# Patient Record
Sex: Male | Born: 2000 | Race: Black or African American | Hispanic: No | Marital: Single | Smoking: Never smoker
Health system: Southern US, Community
[De-identification: ages and names within clinical notes are randomized; demographics above are authoritative.]

---
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
## 2022-12-26 ENCOUNTER — Ambulatory Visit (HOSPITAL_COMMUNITY)
Admission: EM | Admit: 2022-12-26 | Discharge: 2022-12-26 | Disposition: A | Discharge status: Home / Self Care | Payer: Medicaid Other | Attending: Psychiatry | Admitting: Psychiatry

## 2022-12-26 ENCOUNTER — Encounter (HOSPITAL_COMMUNITY): Payer: Self-pay | Admitting: Psychiatry

## 2022-12-26 DIAGNOSIS — F32A Depression, unspecified: Secondary | ICD-10-CM | POA: Diagnosis not present

## 2022-12-26 DIAGNOSIS — F419 Anxiety disorder, unspecified: Secondary | ICD-10-CM | POA: Diagnosis not present

## 2022-12-26 DIAGNOSIS — R45851 Suicidal ideations: Secondary | ICD-10-CM | POA: Insufficient documentation

## 2022-12-26 NOTE — Discharge Instructions (Addendum)
Please come to [facility] (this facility, second floor, [address] [place], [place] [address]) for medication management and counseling services.  Walk in hours for medication management are: Monday through Friday 7:30AM - 11AM Walk in hours for counseling are: Monday through Thursday 7:30AM-4PM and Friday 12PM-4PM  Walk ins are seen on a first come, first served basis. Walk in spots are limited. To increase the likelihood of being the same day, please come early, by 7AM.

## 2022-12-26 NOTE — ED Notes (Signed)
Patient discharged by Jacqueline Lee, NP with written and verbal instructions. 

## 2022-12-26 NOTE — Progress Notes (Signed)
   12/26/22 1547  [facility] Triage Screening (Walk-ins at [facility] only)  How Did You Hear About [place]? Self  What Is the Reason for Your Visit/Call Today? Pt is a 21 yo male who presented voluntarily and unaccompanied due to worsening depression and anxiety. Pt stated that he was referred to the [facility] by the [facility] from his employment. Pt stated he has been struggling with depression ana anxiety over the last few weeks especially and recently has been having suicidal thoughts "pop in his head." Pt stated he would never act on these thoughts and had no specific plan. Pt stated that he was intoxicated from drinking alcohol on Christmas Day while with his family and thought of continuing to drink an excess of alcohol and in that way kill himself. Pt stated that if he had been sober he never would have had as much alcohol. Pt stated he has since stopped drinking alcohol entirely and stated he plans to stop permanently. Pt denied any HI but stated that he has thought about "beating up" his younger brother because they don't get along. Pt denied NSSH, AVH and paranoia. Pt reported also occasionally smoking cannabis but stated he decided to stop that as well. Pt stated he is feeling sad, hopeless at times, overly tired at times, like a failure at times and having rouble concentrating. Pt stated that he has been having trouble in his romantic relationship recently but did not want to give details. Pt stated he has been in the same relationship for about a year.  How Long Has This Been Causing You Problems? 1 wk - 1 month  Have You Recently Had Any Thoughts About Hurting Yourself? Yes  How long ago did you have thoughts about hurting yourself? Christmas Day  Are You Planning to Commit Suicide/Harm Yourself At This time? No  Have you Recently Had Thoughts About Hurting Someone Karolee Ohs? Yes  How long ago did you have thoughts of harming others? Christmas Day  Are You Planning To Harm Someone At This Time? No  Are you currently  experiencing any auditory, visual or other hallucinations? No  Have You Used Any Alcohol or Drugs in the Past 24 Hours? No  Do you have any current medical co-morbidities that require immediate attention? No  Clinician description of patient physical appearance/behavior: Pt was alert, calm, cooperative and seemed fully oriented. Pt's speecha nd movement were within normal limits. Pt's judgment and insight seemed good. Pt's mood seemed sad and his flat affect seemed congruent.  What Do You Feel Would Help You the Most Today? Treatment for Depression or other mood problem  If access to [facility] was not available, would you have sought care in the Emergency Department? Yes  Determination of Need Routine (7 days)  Options For Referral Medication Management;Outpatient Therapy   Vernis Cabacungan T. Jimmye Norman, MS, [facility], [facility] Triage Specialist [facility]
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
## 2023-01-22 ENCOUNTER — Encounter (HOSPITAL_COMMUNITY): Payer: Self-pay

## 2023-01-22 ENCOUNTER — Ambulatory Visit (INDEPENDENT_AMBULATORY_CARE_PROVIDER_SITE_OTHER): Payer: Medicaid Other | Admitting: Mental Health

## 2023-01-22 DIAGNOSIS — F322 Major depressive disorder, single episode, severe without psychotic features: Secondary | ICD-10-CM
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
## 2023-01-22 NOTE — Progress Notes (Unsigned)
Safety Plan   Step One: Remove Access   What might I use to hurt myself?Razor, knifeRazor, knifeWhat other actions can I take to limit ways that I can hurt myself?Does not keep around him.Does not keep around him. Step Two: Warning Signs   What are my specific triggers or warning signs a crisis is developing?Feeling down, sad, or crying; Feeling hopeless things won't ever get better; Feeling worthless or a failureFeeling down, sad, or crying; Feeling hopeless things won't ever get better; Feeling worthless or a failure Step Three: Distracting Activities   What engaging and calming activities can I do to distract myself?Go online, play video games; Read; Meditate, pray; Go for a walk, exerciseGo online, play video games; Read; Meditate, pray; Go for a walk, exercise Step Four: Distracting People and Places   Who helps me to take my mind off of my problems?[facility]Where can I go to take my mind off my problems?ChurchChurch Step Five: Social Support   Which family members or friends can I talk to to help me feel better?DeniesDeniesWho can help me get through the crisis?LevetteLevetteWho is supportive?Arneta Cliche Step Six: Professional Help   Where can I get help?[place] [facility]/ [facility] [phone] [facility])[address] - [facility]/ [facility]/ [facility] -[phone] Altern[facility])

## 2023-02-01 ENCOUNTER — Ambulatory Visit (HOSPITAL_COMMUNITY): Payer: Medicaid Other | Admitting: Physician Assistant

## 2023-02-01 ENCOUNTER — Encounter (HOSPITAL_COMMUNITY): Payer: Self-pay

## 2023-02-05 ENCOUNTER — Encounter (HOSPITAL_COMMUNITY): Payer: Self-pay | Admitting: Physician Assistant

## 2023-02-05 ENCOUNTER — Ambulatory Visit (INDEPENDENT_AMBULATORY_CARE_PROVIDER_SITE_OTHER): Payer: Medicaid Other | Admitting: Physician Assistant

## 2023-02-05 VITALS — BP 132/74 | HR 58 | Ht 68.0 in | Wt 159.2 lb

## 2023-02-05 DIAGNOSIS — F332 Major depressive disorder, recurrent severe without psychotic features: Secondary | ICD-10-CM

## 2023-02-05 DIAGNOSIS — F411 Generalized anxiety disorder: Secondary | ICD-10-CM | POA: Diagnosis not present
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
## 2023-02-14 ENCOUNTER — Ambulatory Visit (INDEPENDENT_AMBULATORY_CARE_PROVIDER_SITE_OTHER): Payer: Medicaid Other | Admitting: Mental Health

## 2023-02-14 DIAGNOSIS — F332 Major depressive disorder, recurrent severe without psychotic features: Secondary | ICD-10-CM | POA: Insufficient documentation

## 2023-02-14 DIAGNOSIS — F411 Generalized anxiety disorder: Secondary | ICD-10-CM
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
## 2023-02-19 ENCOUNTER — Encounter (HOSPITAL_COMMUNITY): Payer: Self-pay | Admitting: Physician Assistant

## 2023-02-19 ENCOUNTER — Ambulatory Visit (INDEPENDENT_AMBULATORY_CARE_PROVIDER_SITE_OTHER): Payer: Medicaid Other | Admitting: Physician Assistant

## 2023-02-19 DIAGNOSIS — F411 Generalized anxiety disorder: Secondary | ICD-10-CM

## 2023-02-19 DIAGNOSIS — F331 Major depressive disorder, recurrent, moderate: Secondary | ICD-10-CM

## 2023-02-19 MED ORDER — ESCITALOPRAM OXALATE 10 MG PO TABS: 10.0000 mg | ORAL_TABLET | Freq: Every day | ORAL | 1 refills | Status: DC

## 2023-02-19 NOTE — Progress Notes (Signed)
BH MD/PA/NP OP Progress Note  02/19/2023 9:46 PM Garcia Carrisalez  MRN:  [id number]  Chief Complaint:  Chief Complaint  Patient presents with   Follow-up   Medication Management   HPI:   Chad Marshall is a 22 year old, African-American male with a past psychiatric history significant for major depressive disorder and anxiety who presents to [facility] for follow-up and medication management.  Patient is not currently taking any psychiatric medications at this time.  Since the last encounter, patient states that he has felt bad.  He reports that he recently went to see his cousin in hopes of redeeming their relationship; however, there was a disagreement and now he feels that the relationship will never be resolved.  Patient endorses experiencing depressive episodes but states that they were not big and he experienced no breakdowns.  He does admit to experiencing dissociation triggered by distant memories from his past.  Patient endorses experiencing depressive episodes every day characterized by lack of motivation, irritability, and intrusive thoughts.  Patient states that his intrusive thoughts are about mistaking may with a girl as well as unresolved issues with his brothers.  Patient continues to endorse elevated anxiety and rates his anxiety at 10 out of 10.  A PHQ-9 screen was performed with the patient scoring a 17.  A GAD-7 screen was also performed with the patient scoring a 16.  Patient is alert and oriented x 4, calm, cooperative, and engaged in conversation during the encounter.  Patient endorses empty mood.  Patient denies suicidal or homicidal ideation.  He further denies auditory or visual hallucinations and does not appear to be responding to internal/external stimuli.  Patient endorses issues with sleep and states that he often has to force himself to sleep through the use of Benadryl.  Patient endorses good appetite and eats on average 3 meals  per day.  Patient denies alcohol consumption, tobacco use, and illicit drug use.  Visit Diagnosis:    ICD-10-CM   1. Moderate episode of recurrent major depressive disorder (HCC)  F33.1 escitalopram (LEXAPRO) 10 MG tablet    DISCONTINUED: escitalopram (LEXAPRO) 10 MG tablet    2. Anxiety state  F41.1 escitalopram (LEXAPRO) 10 MG tablet    DISCONTINUED: escitalopram (LEXAPRO) 10 MG tablet      Past Psychiatric History:  Major depressive disorder Anxiety  Past Medical History: History reviewed. No pertinent past medical history. History reviewed. No pertinent surgical history.  Family Psychiatric History:  Patient denies a family history of psychiatric illness   Family history of suicide attempt: Patient denies Family history of homicide attempt: Patient reports that his brother died via homicide related event Family history of substance abuse: Patient is unsure family history of substance abuse  Family History: History reviewed. No pertinent family history.  Social History:  Social History   Socioeconomic History   Marital status: Single    Spouse name: Not on file   Number of children: Not on file   Years of education: Not on file   Highest education level: Not on file  Occupational History   Not on file  Tobacco Use   Smoking status: Never   Smokeless tobacco: Never  Substance and Sexual Activity   Alcohol use: No   Drug use: No   Sexual activity: Never  Other Topics Concern   Not on file  Social History Narrative   Not on file   Social Determinants of Health   Financial Resource Strain: Low Risk  (01/22/2023)  Overall Financial Resource Strain (CARDIA)    Difficulty of Paying Living Expenses: Not very hard  Food Insecurity: No Food Insecurity (01/22/2023)   Hunger Vital Sign    Worried About Running Out of Food in the Last Year: Never true    Ran Out of Food in the Last Year: Never true  Transportation Needs: No Transportation Needs (01/22/2023)   PRAPARE -  Hydrologist (Medical): No    Lack of Transportation (Non-Medical): No  Physical Activity: Inactive (01/22/2023)   Exercise Vital Sign    Days of Exercise per Week: 0 days    Minutes of Exercise per Session: 0 min  Stress: Stress Concern Present (01/22/2023)   [place]    Feeling of Stress : Very much  Social Connections: Socially Isolated (01/22/2023)   Social Connection and Isolation Panel [NHANES]    Frequency of Communication with Friends and Family: Never    Frequency of Social Gatherings with Friends and Family: Once a week    Attends Religious Services: More than 4 times per year    Active Member of [employer] or Organizations: No    Attends Music therapist: Never    Marital Status: Never married    Allergies: No Known Allergies  Metabolic Disorder Labs: No results found for: "HGBA1C", "MPG" No results found for: "PROLACTIN" No results found for: "CHOL", "TRIG", "HDL", "CHOLHDL", "VLDL", "LDLCALC" No results found for: "TSH"  Therapeutic Level Labs: No results found for: "LITHIUM" No results found for: "VALPROATE" No results found for: "CBMZ"  Current Medications: Current Outpatient Medications  Medication Sig Dispense Refill   azithromycin (ZITHROMAX) 250 MG tablet Take 1 tablet (250 mg total) by mouth daily. Take first 2 tablets together, then 1 every day until finished. 6 tablet 0   escitalopram (LEXAPRO) 10 MG tablet Take 1 tablet (10 mg total) by mouth daily. 30 tablet 1   guaifenesin (ROBITUSSIN) 100 MG/5ML syrup Take 5-10 mLs (100-200 mg total) by mouth every 4 (four) hours as needed for cough. 60 mL 0   ibuprofen (ADVIL,MOTRIN) 800 MG tablet Take 1 tablet (800 mg total) by mouth every 8 (eight) hours as needed. 21 tablet 0   promethazine-dextromethorphan (PROMETHAZINE-DM) 6.25-15 MG/5ML syrup Take 5 mLs by mouth 4 (four) times daily as needed for cough. 120 mL 0    No current facility-administered medications for this visit.     Musculoskeletal: Strength & Muscle Tone: within normal limits Gait & Station: normal Patient leans: N/A  Psychiatric Specialty Exam: Review of Systems  Psychiatric/Behavioral:  Positive for sleep disturbance. Negative for decreased concentration, dysphoric mood, hallucinations, self-injury and suicidal ideas. The patient is nervous/anxious. The patient is not hyperactive.     There were no vitals taken for this visit.There is no height or weight on file to calculate BMI.  General Appearance: Casual  Eye Contact:  Good  Speech:  Clear and Coherent and Normal Rate  Volume:  Normal  Mood:  Anxious and Depressed  Affect:  Appropriate  Thought Process:  Coherent, Goal Directed, and Descriptions of Associations: Intact  Orientation:  Full (Time, Place, and Person)  Thought Content: WDL   Suicidal Thoughts:  No  Homicidal Thoughts:  No  Memory:  Immediate;   Good Recent;   Good Remote;   Good  Judgement:  Good  Insight:  Fair  Psychomotor Activity:  Normal  Concentration:  Concentration: Good and Attention Span: Good  Recall:  Good  Fund of Knowledge: Good  Language: Good  Akathisia:  No  Handed:  Left  AIMS (if indicated): not done  Assets:  Communication Skills Desire for Improvement Housing Social Support Transportation Vocational/Educational  ADL's:  Intact  Cognition: WNL  Sleep:  Fair   Screenings: AUDIT    Health and safety inspector from 01/22/2023 in [facility]  Alcohol Use Disorder Identification Test Final Score (AUDIT) 7      GAD-7    Flowsheet Row Clinical Support from 02/05/2023 in [facility] Counselor from 01/22/2023 in [facility]  Total GAD-7 Score 16 15      PHQ2-9    Flowsheet Row Clinical Support from 02/19/2023 in [place] from 02/05/2023 in  [facility] Counselor from 01/22/2023 in [facility] from 12/26/2022 in [facility]  PHQ-2 Total Score '4 5 5 2  '$ PHQ-9 Total Score '17 18 26 10      '$ [place] from 02/19/2023 in [place] from 02/05/2023 in [facility] Counselor from 01/22/2023 in [place] CATEGORY High Risk High Risk High Risk        Assessment and Plan:   Vicent Daniel is a 22 year old, African-American male with a past psychiatric history significant for major depressive disorder and anxiety who presents to [facility] for follow-up and medication management.  She is not currently on any medications during the time of the encounter but after deliberation, patient agrees to be placed on medication for the management of his depressive symptoms and anxiety.  Patient states that he experiences depressive episodes every day characterized by lack of motivation, irritability, low mood, and intrusive thoughts.  Patient also endorses anxiety he rates a 10 out of 10.  Patient's symptoms appear to be attributed to unresolved issues from his past.  Patient was recommended being placed on escitalopram 10 mg daily for the management of his depressive symptoms and anxiety.  Patient was agreeable to recommendation.  Patient's medication to be e-prescribed to pharmacy of choice.  Collaboration of Care: Collaboration of Care: Medication Management AEB provider managing patient's psychiatric medications, Psychiatrist AEB patient being seen by a mental health provider, and Referral or follow-up with counselor/therapist AEB patient being seen by a licensed clinical social worker at this facility  Patient/Guardian was advised Release of Information must be obtained prior to any record  release in order to collaborate their care with an outside provider. Patient/Guardian was advised if they have not already done so to contact the registration department to sign all necessary forms in order for [place] to release information regarding their care.   Consent: Patient/Guardian gives verbal consent for treatment and assignment of benefits for services provided during this visit. Patient/Guardian expressed understanding and agreed to proceed.   1. Moderate episode of recurrent major depressive disorder (HCC)  - escitalopram (LEXAPRO) 10 MG tablet; Take 1 tablet (10 mg total) by mouth daily.  Dispense: 30 tablet; Refill: 1  2. Anxiety state  - escitalopram (LEXAPRO) 10 MG tablet; Take 1 tablet (10 mg total) by mouth daily.  Dispense: 30 tablet; Refill: 1  Patient to follow-up in 5 weeks Provider spent a total of 17 minutes with the patient/reviewing patient's chart  Malachy Mood, PA 02/19/2023, 9:46 PM

## 2023-03-21 ENCOUNTER — Ambulatory Visit (INDEPENDENT_AMBULATORY_CARE_PROVIDER_SITE_OTHER): Payer: Medicaid Other | Admitting: Mental Health

## 2023-03-21 DIAGNOSIS — F332 Major depressive disorder, recurrent severe without psychotic features: Secondary | ICD-10-CM | POA: Diagnosis not present

## 2023-03-21 NOTE — Progress Notes (Signed)
   THERAPIST PROGRESS NOTE  Session Time: 4:32pm ( 54 minutes)  Participation Level: Active  Behavioral Response: CasualAlertWNL  Type of Therapy: Individual Therapy  Treatment Goals addressed:  Woodie will decrease sxs of depression AEB development of x 3 effective coping skills with ability to reframe thinking patterns as needed within the next 90 days.      ProgressTowards Goals: Progressing  Interventions: CBT and Supportive  Summary: Chad Marshall is a 22 y.o. male who presents with dx of major depression, single severe. Chad Marshall presents for session alert and oriented; mood and affect adequate; WNL. Pleasant in presentation. Speech clear and coherent at normal rate and tone. Shares for depressive and anxiety sxs to have reduced and shares improvement in moods. Shares to have been ongoingly engaged in church and to have been baptized. 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 Shares working to come to acceptance with break up and ability to work on self. Notes work to be going well and able to interact with others at work in pleasant fashion. Shares continues to have feelings of frustration over incident in which took place over Christmas and feelings as if there is no justice. Shares feelings of wondering of his ability to gain stability in the community and develop into his manhood. Shares hx of father and not feeling supported with father wanting him to have a street mentality. Shares distorted thoughts with difficulty exploring alternatives. Able to engage with therapist and work to engage in cognitive coping. Denies SI/HI. Some progress with goals and improvement in sxs.   Suicidal/Homicidal: Nowithout intent/plan  Therapist Response: Therapist engaged Chad Marshall in therapy session. Engaged in rapport building. Completed check in and assessed for current level of functioning, sxs management and level of stressors. Provided safe space for Chad Marshall to  share thoughts and feelings related to stressors and feelings of depression. Explored with Chad Marshall factors that were going well for him and explored areas of difficulty and feelings of being stuck. Explored with Chad Marshall distorted thinking patterns and working to reframe thoughts and engage in positive affirmations and support and encouragement of self. Explored ability to set small goals for self and work towards increase self-esteem and self-confidence and identifying areas of progress in which he has already made for self. Reviewed session, provided follow up. Assessed for safety.   Plan: Return again in x 4 weeks.  Diagnosis: Severe episode of recurrent major depressive disorder, without psychotic features ([place])  Collaboration of Care: Other None  Patient/Guardian was advised Release of Information must be obtained prior to any record release in order to collaborate their care with an outside provider. Patient/Guardian was advised if they have not already done so to contact the registration department to sign all necessary forms in order for [place] to release information regarding their care.   Consent: Patient/Guardian gives verbal consent for treatment and assignment of benefits for services provided during this visit. Patient/Guardian expressed understanding and agreed to proceed.   Rockey Situ [place], [facility] 03/25/2023

## 2023-03-27 ENCOUNTER — Ambulatory Visit (INDEPENDENT_AMBULATORY_CARE_PROVIDER_SITE_OTHER): Payer: Medicaid Other | Admitting: Physician Assistant

## 2023-03-27 ENCOUNTER — Encounter (HOSPITAL_COMMUNITY): Payer: Self-pay | Admitting: Physician Assistant

## 2023-03-27 DIAGNOSIS — F411 Generalized anxiety disorder: Secondary | ICD-10-CM | POA: Diagnosis not present

## 2023-03-27 DIAGNOSIS — F331 Major depressive disorder, recurrent, moderate: Secondary | ICD-10-CM

## 2023-03-27 MED ORDER — ESCITALOPRAM OXALATE 20 MG PO TABS: 20.0000 mg | ORAL_TABLET | Freq: Every day | ORAL | 1 refills | Status: AC

## 2023-03-27 NOTE — Progress Notes (Signed)
BH MD/PA/NP OP Progress Note  03/27/2023 5:50 PM Chad Marshall  MRN:  [id number]  Chief Complaint:  Chief Complaint  Patient presents with   Follow-up   Medication Management   HPI:   Chad Marshall is a 22 year old, African-American male with a past psychiatric history significant for major depressive disorder and anxiety who presents to [facility] for follow-up and medication management.  Patient is currently being managed on the following medication: Escitalopram 10 mg daily.  Patient reports no issues or concerns regarding his current medication regimen.  Patient denies any adverse side effects at this time.  Patient does not believe that his medication is making much of a difference but does deny experiencing any depressive symptoms.  Patient endorses anxiety and rates his anxiety as 7 out of 10.  Patient denies experiencing any major life changes or events but states that he has been mentoring someone. A GAD-7 screen was performed with the patient scoring an 18.  Patient is alert and oriented x 4, calm, cooperative, and engaged in conversation during the encounter.  Patient endorses okay mood but states that he is self-conscious.  Patient denies suicidal or homicidal ideations.  He further denies auditory or visual hallucinations and does not appear to be responding to internal/external stimuli.  Patient endorses poor sleep and receives on average 3 to 4 hours of sleep each night.  Patient states that he often has to take Benadryl to force himself to sleep.  Patient endorses fair appetite.  Patient denies alcohol consumption, tobacco use, and illicit drug use.  Visit Diagnosis:    ICD-10-CM   1. Anxiety state  F41.1 escitalopram (LEXAPRO) 20 MG tablet    2. Moderate episode of recurrent major depressive disorder (HCC)  F33.1 escitalopram (LEXAPRO) 20 MG tablet      Past Psychiatric History:  Major depressive disorder Anxiety  Past Medical  History: History reviewed. No pertinent past medical history. History reviewed. No pertinent surgical history.  Family Psychiatric History:  Patient denies a family history of psychiatric illness   Family history of suicide attempt: Patient denies Family history of homicide attempt: Patient reports that his brother died via homicide related event Family history of substance abuse: Patient is unsure family history of substance abuse  Family History: History reviewed. No pertinent family history.  Social History:  Social History   Socioeconomic History   Marital status: Single    Spouse name: Not on file   Number of children: Not on file   Years of education: Not on file   Highest education level: Not on file  Occupational History   Not on file  Tobacco Use   Smoking status: Never   Smokeless tobacco: Never  Substance and Sexual Activity   Alcohol use: No   Drug use: No   Sexual activity: Never  Other Topics Concern   Not on file  Social History Narrative   Not on file   Social Determinants of Health   Financial Resource Strain: Low Risk  (01/22/2023)   Overall Financial Resource Strain (CARDIA)    Difficulty of Paying Living Expenses: Not very hard  Food Insecurity: No Food Insecurity (01/22/2023)   Hunger Vital Sign    Worried About Running Out of Food in the Last Year: Never true    Ran Out of Food in the Last Year: Never true  Transportation Needs: No Transportation Needs (01/22/2023)   PRAPARE - Hydrologist (Medical): No  Lack of Transportation (Non-Medical): No  Physical Activity: Inactive (01/22/2023)   Exercise Vital Sign    Days of Exercise per Week: 0 days    Minutes of Exercise per Session: 0 min  Stress: Stress Concern Present (01/22/2023)   [place]    Feeling of Stress : Very much  Social Connections: Socially Isolated (01/22/2023)   Social Connection and  Isolation Panel [NHANES]    Frequency of Communication with Friends and Family: Never    Frequency of Social Gatherings with Friends and Family: Once a week    Attends Religious Services: More than 4 times per year    Active Member of [employer] or Organizations: No    Attends Music therapist: Never    Marital Status: Never married    Allergies: No Known Allergies  Metabolic Disorder Labs: No results found for: "HGBA1C", "MPG" No results found for: "PROLACTIN" No results found for: "CHOL", "TRIG", "HDL", "CHOLHDL", "VLDL", "LDLCALC" No results found for: "TSH"  Therapeutic Level Labs: No results found for: "LITHIUM" No results found for: "VALPROATE" No results found for: "CBMZ"  Current Medications: Current Outpatient Medications  Medication Sig Dispense Refill   azithromycin (ZITHROMAX) 250 MG tablet Take 1 tablet (250 mg total) by mouth daily. Take first 2 tablets together, then 1 every day until finished. 6 tablet 0   escitalopram (LEXAPRO) 20 MG tablet Take 1 tablet (20 mg total) by mouth daily. 30 tablet 1   guaifenesin (ROBITUSSIN) 100 MG/5ML syrup Take 5-10 mLs (100-200 mg total) by mouth every 4 (four) hours as needed for cough. 60 mL 0   ibuprofen (ADVIL,MOTRIN) 800 MG tablet Take 1 tablet (800 mg total) by mouth every 8 (eight) hours as needed. 21 tablet 0   promethazine-dextromethorphan (PROMETHAZINE-DM) 6.25-15 MG/5ML syrup Take 5 mLs by mouth 4 (four) times daily as needed for cough. 120 mL 0   No current facility-administered medications for this visit.     Musculoskeletal: Strength & Muscle Tone: within normal limits Gait & Station: normal Patient leans: N/A  Psychiatric Specialty Exam: Review of Systems  Psychiatric/Behavioral:  Positive for sleep disturbance. Negative for decreased concentration, dysphoric mood, hallucinations, self-injury and suicidal ideas. The patient is nervous/anxious. The patient is not hyperactive.     Blood pressure  122/78, pulse 73, height 5\' 8"  (1.727 m), weight 163 lb (73.9 kg), SpO2 99 %.Body mass index is 24.78 kg/m.  General Appearance: Casual  Eye Contact:  Good  Speech:  Clear and Coherent and Normal Rate  Volume:  Normal  Mood:  Anxious  Affect:  Appropriate  Thought Process:  Coherent, Goal Directed, and Descriptions of Associations: Intact  Orientation:  Full (Time, Place, and Person)  Thought Content: WDL   Suicidal Thoughts:  No  Homicidal Thoughts:  No  Memory:  Immediate;   Good Recent;   Good Remote;   Good  Judgement:  Good  Insight:  Fair  Psychomotor Activity:  Normal  Concentration:  Concentration: Good and Attention Span: Good  Recall:  Good  Fund of Knowledge: Good  Language: Good  Akathisia:  No  Handed:  Left  AIMS (if indicated): not done  Assets:  Communication Skills Desire for Improvement Housing Social Support Transportation Vocational/Educational  ADL's:  Intact  Cognition: WNL  Sleep:  Fair   Screenings: AUDIT    Health and safety inspector from 01/22/2023 in [facility]  Alcohol Use Disorder Identification Test Final Score (AUDIT) 7  GAD-7    Flowsheet Row Clinical Support from 03/27/2023 in [facility] Clinical Support from 02/05/2023 in [facility] Counselor from 01/22/2023 in [facility]  Total GAD-7 Score 18 16 15       PHQ2-9    Flowsheet Row Clinical Support from 03/27/2023 in [facility] Clinical Support from 02/19/2023 in [facility] Clinical Support from 02/05/2023 in [facility] Counselor from 01/22/2023 in [facility] ED from 12/26/2022 in [place]  PHQ-2 Total Score 0 4 5 5 2   PHQ-9 Total Score -- 17 18 26 10       Flowsheet Row Clinical Support from 03/27/2023 in [place] from 02/19/2023 in [place] from 02/05/2023 in [place] CATEGORY High Risk High Risk High Risk        Assessment and Plan:   Chad Marshall is a 22 year old, African-American male with a past psychiatric history significant for major depressive disorder and anxiety who presents to [facility] for follow-up and medication management.  Patient presents today encounter stating that the medication (Lexapro 10 mg daily) have not been helpful in managing his symptoms; however, he does deny depressive symptoms.  Patient continues to endorse anxiety and rates his anxiety as 7 out of 10.  Patient was open to the idea of adjusting his Lexapro from 10 mg to 20 mg daily for the management of his anxiety and depressive symptoms.  Patient's medication to be e-prescribed to pharmacy of choice.  Collaboration of Care: Collaboration of Care: Medication Management AEB provider managing patient's psychiatric medications, Psychiatrist AEB patient being seen by a mental health provider, and Referral or follow-up with counselor/therapist AEB patient being seen by a licensed clinical social worker at this facility  Patient/Guardian was advised Release of Information must be obtained prior to any record release in order to collaborate their care with an outside provider. Patient/Guardian was advised if they have not already done so to contact the registration department to sign all necessary forms in order for [place] to release information regarding their care.   Consent: Patient/Guardian gives verbal consent for treatment and assignment of benefits for services provided during this visit. Patient/Guardian expressed understanding and agreed to proceed.   1. Moderate episode of recurrent major depressive disorder (HCC)  - escitalopram (LEXAPRO) 10 MG  tablet; Take 1 tablet (10 mg total) by mouth daily.  Dispense: 30 tablet; Refill: 1  2. Anxiety state  - escitalopram (LEXAPRO) 10 MG tablet; Take 1 tablet (10 mg total) by mouth daily.  Dispense: 30 tablet; Refill: 1  Patient to follow-up in 6 weeks Provider spent a total of 21 minutes with the patient/reviewing patient's chart  Malachy Mood, PA 03/27/2023, 5:50 PM

## 2023-04-25 ENCOUNTER — Ambulatory Visit (INDEPENDENT_AMBULATORY_CARE_PROVIDER_SITE_OTHER): Payer: Medicaid Other | Admitting: Mental Health

## 2023-04-25 DIAGNOSIS — F332 Major depressive disorder, recurrent severe without psychotic features: Secondary | ICD-10-CM | POA: Diagnosis not present

## 2023-04-25 NOTE — Progress Notes (Signed)
   THERAPIST PROGRESS NOTE  Session Time: 4:33pm ( 55 minutes)  Participation Level: Active  Behavioral Response: CasualAlertDysphoric  Type of Therapy: Individual Therapy  Treatment Goals addressed: Chad Marshall will decrease sxs of depression AEB development of x 3 effective coping skills with ability to reframe thinking patterns as needed within the next 90 days.    ProgressTowards Goals: Progressing  Interventions: CBT and Supportive  Summary:  Chad Marshall is a 22 y.o. male who presents with dx of major depression, single severe. Chad Marshall presents for session alert and oriented; mood and affect depressed; low. Speech clear and coherent at normal rate and tone. Shares for depressive sxs to have increased. Notes to have been inconsistent at times with medications. Shares to have reached out to brother in working to communicate forgiveness however brother continues to state hurtful things to him. Shares to feel broken. Notes to have ceased attending church and restarted drinking behaviors. Shares thoughts of wishing to have been born in different circumstances and denies seeing a future for himself. Shares has been isolating self and work to be stressful. Shares concerning history of poor choices in which he has made in his past with difficuty moving on. Denies SI. Agrees to work on focusing on what he feels is best for himself vs what others may say towards him. Reviewed coping skills to support in management of moods with cognitive cognitive coping. Increase in depressive sxs noted.  Suicidal/Homicidal: Nowithout intent/plan  Therapist Response: Therapist engaged Chad Marshall in therapy session. Completed check in and assessed for current level of functioning, sxs management and level of stressors. Provided safe space for Chad Marshall to share thoughts and feelings related to stressors and feelings of depression. Explored with Chad Marshall factors that contribute to increase in depressive sxs. Explored ability to work to  reframe distorted maladaptive thinking and explored ability to consider the messenger that was communicating negative comments towards him. Explored for prosocial natural supports and encouraged positive self-talk and cognitive coping strategies. Educated on alcohol not helpful with management of depression and encouraged to engage in prosocial coping. Provided support and encouragement and encouraged to explore engaging in in life that he enjoys. Assessed for safety concerns; reviewed session and provided follow up appointment.  Plan: Return again in x 4 weeks.  Diagnosis: Severe episode of recurrent major depressive disorder, without psychotic features (HCC)  Collaboration of Care: Other None  Patient/Guardian was advised Release of Information must be obtained prior to any record release in order to collaborate their care with an outside provider. Patient/Guardian was advised if they have not already done so to contact the registration department to sign all necessary forms in order for [place] to release information regarding their care.   Consent: Patient/Guardian gives verbal consent for treatment and assignment of benefits for services provided during this visit. Patient/Guardian expressed understanding and agreed to proceed.   Chad Marshall, [facility] 04/29/2023

## 2023-05-08 ENCOUNTER — Encounter (HOSPITAL_COMMUNITY): Payer: Medicaid Other | Admitting: Physician Assistant

## 2023-06-04 ENCOUNTER — Ambulatory Visit (HOSPITAL_COMMUNITY): Payer: Medicaid Other | Admitting: Mental Health

## 2023-07-18 ENCOUNTER — Ambulatory Visit (INDEPENDENT_AMBULATORY_CARE_PROVIDER_SITE_OTHER): Payer: Medicaid Other | Admitting: Mental Health

## 2023-07-18 DIAGNOSIS — F332 Major depressive disorder, recurrent severe without psychotic features: Secondary | ICD-10-CM
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
## 2023-09-05 ENCOUNTER — Ambulatory Visit (INDEPENDENT_AMBULATORY_CARE_PROVIDER_SITE_OTHER): Payer: Medicaid Other | Admitting: Mental Health

## 2023-09-05 DIAGNOSIS — F411 Generalized anxiety disorder: Secondary | ICD-10-CM

## 2023-09-05 DIAGNOSIS — F332 Major depressive disorder, recurrent severe without psychotic features: Secondary | ICD-10-CM

## 2023-09-05 NOTE — Progress Notes (Signed)
   THERAPIST PROGRESS NOTE  Session Time: 4:05 pm ( 55 minutes)  Participation Level: Active  Behavioral Response: CasualAlertDysphoric  Type of Therapy: Individual Therapy  Treatment Goals addressed:  Danila will decrease sxs of depression AEB development of x 3 effective coping skills with ability to reframe thinking patterns as needed within the next 90 days.    ProgressTowards Goals: Not Progressing  Interventions: CBT and Supportive  Summary: Chad Marshall is a 22 y.o. male who presents with dx of major depression, single severe. Chad Marshall presents for session alert and oriented; mood and affect depressed; low. Speech clear and coherent at normal rate and tone. Shares for depressive sxs to persist. Denies taking medications and does not feel as if they work. Explored with therapist benefits of taking medications and ability to advocate for self with ongoing medications checks. 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 Shares to have ceased presentation to church. Explored with therapist working to set SMART goals for self and working to increase level of satisfaction in daily. With support of therapist identifies goals for ability to secure permit as well options for additional work. Notes for thoughts to continue to be negative with difficulty reframing. Agrees to work towards obtaining permit and studying x 2 daily for 30 minutes for the next 2 to 3 weeks. Denies SI/HI. No change in sxs; currently stuck with goals and working to identify faulty thinking patterns.   Suicidal/Homicidal: Nowithout intent/plan  Therapist Response: Therapist engaged Chad Marshall in therapy session. Completed check in and assessed for current level of functioning, sxs management and level of stressors. Provided safe space for Chad Marshall to share thoughts and  feelings related to stressors and feelings of being stuck. Encouraged medication compliance and educated on need to follow up with provider. Encouraged restarting medications and scheduling follow up. Provided support and encouragement and validated feelings. Supported in education on setting SMART goals and engaged in exploring ability to work towards obtaining license. Provided information on securing new social security card. Reviewed role in cognitions with sxs of depression and working to increase awareness of faulty thinking patterns. Encouraged working to follow through with identified goals. Reviewed session and provided follow up.   Plan: Return again in 6 weeks.  Diagnosis: Severe episode of recurrent major depressive disorder, without psychotic features (HCC)  Anxiety state  Collaboration of Care: Other None  Patient/Guardian was advised Release of Information must be obtained prior to any record release in order to collaborate their care with an outside provider. Patient/Guardian was advised if they have not already done so to contact the registration department to sign all necessary forms in order for [place] to release information regarding their care.   Consent: Patient/Guardian gives verbal consent for treatment and assignment of benefits for services provided during this visit. Patient/Guardian expressed understanding and agreed to proceed.   Stephan Minister [place], [facility] 09/05/2023

## 2023-09-19 ENCOUNTER — Ambulatory Visit (HOSPITAL_COMMUNITY): Payer: Medicaid Other | Admitting: Physician Assistant

## 2023-10-23 ENCOUNTER — Ambulatory Visit (INDEPENDENT_AMBULATORY_CARE_PROVIDER_SITE_OTHER): Payer: Medicaid Other | Admitting: Mental Health

## 2023-10-23 DIAGNOSIS — F332 Major depressive disorder, recurrent severe without psychotic features: Secondary | ICD-10-CM | POA: Diagnosis not present

## 2023-10-23 DIAGNOSIS — F411 Generalized anxiety disorder: Secondary | ICD-10-CM

## 2023-10-23 NOTE — Progress Notes (Unsigned)
   THERAPIST PROGRESS NOTE  Session Time: 4:09 am ( 54 minutes)  Participation Level: Active  Behavioral Response: CasualAlertDysphoric  Type of Therapy: Individual Therapy  Treatment Goals addressed: Saddiq will decrease sxs of depression AEB development of x 3 effective coping skills with ability to reframe thinking patterns as needed within the next 90 days.    ProgressTowards Goals: Progressing  Interventions: CBT and Supportive  Summary:Chad Marshall is a 22 y.o. male who presents with dx of major depression, single severe. [name] presents for session alert and oriented; mood and affect depressed; low. Speech clear and coherent at normal rate and tone. Shares for depressive sxs to persist. Denies medications to be a support and shares thoughts of waste of time and transportation money to attend appointment when not feeling as if medications were working. Shares feelings of frustration and hopelessness of continuing to fail drivers test. Notes to have been able to secure a car, though it needs some work. Shares for mother to have spoken down to him as well as father about his inability to pass driver's test. Denies for his mother to be an emotionally safe individual and lacks trust. Notes to be taking practice test however denies to have read material on drivers written exam and agrees for therapist to email a copy of reading material. 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 Stress with concern for job to possibly move to [place] and not having license to present. Engaged with therapist in working through processing maladaptive thinking patterns that continue to perpetuate depressive sxs. Shares thoughts on previous relationship and additional feelings of low mood as a result. Receptive of education on medication compliance and processing of finding appropriate medication to be a benefit and advocating for self. Agrees to study material  provided to him by therapist. Denies SI/HI; ongoing work towards goals with minimal change in sxs.   Suicidal/Homicidal: Nowithout intent/plan  Therapist Response: Therapist engaged [name] in therapy session. Completed check in and assessed for current level of functioning, sxs management and level of stressors. Provided safe space for [name] to share thoughts and feelings related to feelings of depression and current stressors. Provided supportive feedback and explored for use of coping skills and ability to reframe distorted cognitions of self. Engaged in guided discovery to explore alternative thoughts and possible outcomes for self. Provided psycho-education on medication management services and ability to advocate for self and needs with therapist concern for lack of adequate progress with sxs. Reviewed session and provided follow up.   Plan: Return again in  x 6 weeks.  Diagnosis: Severe episode of recurrent major depressive disorder, without psychotic features (HCC)  Anxiety state  Collaboration of Care: Other None  Patient/Guardian was advised Release of Information must be obtained prior to any record release in order to collaborate their care with an outside provider. Patient/Guardian was advised if they have not already done so to contact the registration department to sign all necessary forms in order for [place] to release information regarding their care.   Consent: Patient/Guardian gives verbal consent for treatment and assignment of benefits for services provided during this visit. Patient/Guardian expressed understanding and agreed to proceed.   Stephan Minister [place], [facility] 10/24/2023

## 2023-10-25 ENCOUNTER — Encounter (HOSPITAL_COMMUNITY): Payer: Self-pay

## 2023-10-25 ENCOUNTER — Telehealth (HOSPITAL_COMMUNITY): Payer: Medicaid Other | Admitting: Physician Assistant

## 2023-12-20 ENCOUNTER — Ambulatory Visit (HOSPITAL_COMMUNITY): Payer: Medicaid Other | Admitting: Mental Health

## 2024-05-21 NOTE — Progress Notes (Signed)
 Psychiatric consultation completed by Tereso Marchi, NP with recommendation for an overnight observation with a reassessment by psychiatry provider on 05/22/2024 in AM. Patient's BH admission status currently remains as: IVC.

## 2024-05-21 NOTE — ED Notes (Signed)
 Pt moved to room 17 to monitor pt - sats noted to be 70% on room air.  Pt woke up with sternal rub and Pt moved to room 7 - sats increased to 94% on RA while awake

## 2024-05-21 NOTE — ED Notes (Signed)
 Pt with sats of 71% on RA - moved to room 7 for closer monitoring

## 2024-08-25 ENCOUNTER — Ambulatory Visit (HOSPITAL_COMMUNITY): Admitting: Mental Health

## 2024-08-25 ENCOUNTER — Encounter (HOSPITAL_COMMUNITY): Payer: Self-pay

## 2024-08-25 DIAGNOSIS — F332 Major depressive disorder, recurrent severe without psychotic features: Secondary | ICD-10-CM | POA: Diagnosis not present
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
## 2024-10-14 ENCOUNTER — Ambulatory Visit (HOSPITAL_COMMUNITY): Admitting: Mental Health

## 2024-10-21 ENCOUNTER — Ambulatory Visit (HOSPITAL_COMMUNITY): Admitting: Mental Health
